# Patient Record
Sex: Female | Born: 1965 | Race: Black or African American | Hispanic: No | Marital: Married | Smoking: Never smoker
Health system: Southern US, Community
[De-identification: ages and names within clinical notes are randomized; demographics above are authoritative.]

## PROBLEM LIST (undated history)

## (undated) HISTORY — PX: ABDOMINAL HYSTERECTOMY: SHX81

## (undated) HISTORY — PX: OTHER SURGICAL HISTORY: SHX169

## (undated) HISTORY — PX: APPENDECTOMY: SHX54

---

## 1998-12-24 ENCOUNTER — Other Ambulatory Visit: Admission: RE | Admit: 1998-12-24 | Discharge: 1998-12-24 | Payer: Self-pay | Admitting: Obstetrics and Gynecology

## 2000-02-03 ENCOUNTER — Other Ambulatory Visit: Admission: RE | Admit: 2000-02-03 | Discharge: 2000-02-03 | Payer: Self-pay | Admitting: Obstetrics and Gynecology

## 2001-05-13 ENCOUNTER — Encounter: Admission: RE | Admit: 2001-05-13 | Discharge: 2001-05-13 | Payer: Self-pay | Admitting: Obstetrics

## 2003-11-14 ENCOUNTER — Other Ambulatory Visit: Admission: RE | Admit: 2003-11-14 | Discharge: 2003-11-14 | Payer: Self-pay | Admitting: Obstetrics and Gynecology

## 2004-01-01 ENCOUNTER — Encounter: Admission: RE | Admit: 2004-01-01 | Discharge: 2004-01-01 | Payer: Self-pay | Admitting: Obstetrics and Gynecology

## 2004-11-25 ENCOUNTER — Other Ambulatory Visit: Admission: RE | Admit: 2004-11-25 | Discharge: 2004-11-25 | Payer: Self-pay | Admitting: Obstetrics and Gynecology

## 2005-12-01 ENCOUNTER — Other Ambulatory Visit: Admission: RE | Admit: 2005-12-01 | Discharge: 2005-12-01 | Payer: Self-pay | Admitting: Obstetrics and Gynecology

## 2005-12-26 ENCOUNTER — Ambulatory Visit: Payer: Self-pay | Admitting: Internal Medicine

## 2007-08-10 ENCOUNTER — Encounter: Payer: Self-pay | Admitting: *Deleted

## 2007-08-10 DIAGNOSIS — N809 Endometriosis, unspecified: Secondary | ICD-10-CM | POA: Insufficient documentation

## 2007-08-10 DIAGNOSIS — Z9089 Acquired absence of other organs: Secondary | ICD-10-CM

## 2007-08-10 DIAGNOSIS — D649 Anemia, unspecified: Secondary | ICD-10-CM

## 2007-08-10 DIAGNOSIS — Z9079 Acquired absence of other genital organ(s): Secondary | ICD-10-CM | POA: Insufficient documentation

## 2007-08-10 DIAGNOSIS — N390 Urinary tract infection, site not specified: Secondary | ICD-10-CM

## 2009-04-24 ENCOUNTER — Encounter: Payer: Self-pay | Admitting: Internal Medicine

## 2010-05-20 ENCOUNTER — Encounter: Payer: Self-pay | Admitting: Internal Medicine

## 2010-05-20 LAB — CONVERTED CEMR LAB
Basophils Relative: 1 %
HDL: 42 mg/dL
Lymphocytes, automated: 54 %
Neutrophils Relative %: 38 %
Platelets: 262 10*3/uL
RBC: 4.71 M/uL
RDW: 14.2 %
Triglyceride fasting, serum: 72 mg/dL
WBC: 6.7 10*3/uL

## 2010-05-28 ENCOUNTER — Encounter: Payer: Self-pay | Admitting: Internal Medicine

## 2010-07-03 ENCOUNTER — Ambulatory Visit: Payer: Self-pay | Admitting: Internal Medicine

## 2010-07-03 DIAGNOSIS — R229 Localized swelling, mass and lump, unspecified: Secondary | ICD-10-CM | POA: Insufficient documentation

## 2010-07-08 ENCOUNTER — Encounter: Admission: RE | Admit: 2010-07-08 | Discharge: 2010-07-08 | Payer: Self-pay | Admitting: Internal Medicine

## 2010-07-11 ENCOUNTER — Encounter: Payer: Self-pay | Admitting: Internal Medicine

## 2010-10-20 ENCOUNTER — Encounter: Payer: Self-pay | Admitting: Obstetrics and Gynecology

## 2010-10-29 NOTE — Assessment & Plan Note (Signed)
Summary: NEW PT /[employer] / [employer]  #   Vital Signs:  Patient profile:   45 year old female Height:      63 inches (160.02 cm) Weight:      165.8 pounds (75.36 kg) BMI:     29.48 O2 Sat:      98 % on Room air Temp:     97.0 degrees F (36.11 degrees C) oral Pulse rate:   61 / minute BP sitting:   110 / 74  (left arm) Cuff size:   regular  Vitals Entered By: Orlan Leavens RMA (July 03, 2010 8:08 AM)  O2 Flow:  Room air CC: New patient Is Patient Diabetic? No Pain Assessment Patient in pain? no        Primary Care Mabell Esguerra:  Newt Lukes MD  CC:  New patient.  History of Present Illness: new pt to me and our practice, here to est care  here with concern for swelling "lump" located under right axilla onset <1 mo ago - not present at time of PAP/Mammo 05/2010 not a/w pain or redness - enlarging over past week - no other swelling or lumps in neck, left side or groin no fever, NS or weight changes no hx same no drainage or change in deodarant -  Preventive Screening-Counseling & Management  Alcohol-Tobacco     Alcohol drinks/day: <1     Alcohol Counseling: not indicated; use of alcohol is not excessive or problematic     Smoking Status: never     Tobacco Counseling: not indicated; no tobacco use  Caffeine-Diet-Exercise     Diet Counseling: not indicated; diet is assessed to be healthy     Does Patient Exercise: yes     Times/week: 3     Exercise Counseling: not indicated; exercise is adequate     Depression Counseling: not indicated; screening negative for depression  Safety-Violence-Falls     Seat Belt Counseling: not indicated; patient wears seat belts     Helmet Counseling: not applicable     Firearm Counseling: not applicable     Violence Counseling: not indicated; no violence risk noted     Fall Risk Counseling: not indicated; no significant falls noted  Clinical Review Panels:  Prevention   Last Pap Smear:  Interpretation Result:Negative for  intraepithelial Lesion or Malignancy.    (06/18/2010)  Immunizations   Last Tetanus Booster:  Historical (09/29/2004)   Current Medications (verified): 1)  Premarin 0.625 Mg  Tabs (Estrogens Conjugated) .... Take One Tablet Once Daily  Allergies (verified): No Known Drug Allergies  Past History:  Past Medical History: Hx of UTI'S, CHRONIC Hx of ENDOMETRIOSIS, SEVERE  ANEMIA  MD roster: gyn - tomblin  Past Surgical History: APPENDECTOMY, HX OF (ICD-V45.79) TOTAL HYSTERECTOMY AND BILATERAL SALPINGOOPHERECTOMY, HX OF     Family History: Family History of Alcoholism/Addiction (grandparent) Family History of Arthritis (parent) Family History Breast cancer 1st degree relative <50 (other relative) Family History of Colon CA 1st degree relative <60 (grandparent) Family History Diabetes 1st degree relative (grandparent, other relative) Family History High cholesterol (other relative) Family History Hypertension (parent, grandparent) Family History Ovarian cancer (other relative) Stoke & heart disease (grandparent)    Social History: Never Smoked, social alcohol married, 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 I have reviewed all other systems and they were negative.   Physical Exam  General:  alert, well-developed, well-nourished, and  cooperative to examination.    Head:  Normocephalic and atraumatic without obvious abnormalities. No apparent alopecia or balding. Eyes:  vision grossly intact; pupils equal, round and reactive to light.  conjunctiva and lids normal.    Ears:  normal pinnae bilaterally, without erythema, swelling, or tenderness to palpation. TMs clear, without effusion, or cerumen impaction. Hearing grossly normal bilaterally  Mouth:  teeth and gums in good repair; mucous membranes moist, without lesions or ulcers. oropharynx clear without exudate, no erythema.  Neck:  supple, full ROM,  no masses, no thyromegaly; no thyroid nodules or tenderness. no JVD or carotid bruits.   Lungs:  normal respiratory effort, no intercostal retractions or use of accessory muscles; normal breath sounds bilaterally - no crackles and no wheezes.    Heart:  normal rate, regular rhythm, no murmur, and no rub. BLE without edema.  Genitalia:  defer gyn Msk:  No deformity or scoliosis noted of thoracic or lumbar spine.   Neurologic:  alert & oriented X3 and cranial nerves II-XII symetrically intact.  strength normal in all extremities, sensation intact to light touch, and gait normal. speech fluent without dysarthria or aphasia; follows commands with good comprehension.  Skin:  no rashes, vesicles, ulcers, or erythema. No nodules or irregularity to palpation.  Cervical Nodes:  No lymphadenopathy noted Axillary Nodes:  slight prominence R>L axillary LNs - nontendewr, no fluctence - no HS Inguinal Nodes:  No significant adenopathy Psych:  Oriented X3, memory intact for recent and remote, normally interactive, good eye contact, not anxious appearing, not depressed appearing, and not agitated.      Impression & Recommendations:  Problem # 1:  LOCALIZED SUPERFICIAL SWELLING MASS OR LUMP (ICD-782.2)  Orders: Radiology Referral (Radiology)  check [place] for >LN enlargement - no sx or symptoms infx - no HS on exam  Complete Medication List: 1)  Premarin 0.625 Mg Tabs (Estrogens conjugated) .... Take one tablet once daily  Patient Instructions: 1)  it was good to see you today. 2)  will send for records from dr. Henderson Cloud re: your recent physical and labs 3)  we'll make referral for ultrasound of your right underarm as discussed. Our office will contact you regarding this appointment once made. We will then contact you after results recieved and reviewed 4)  Please schedule a follow-up appointment as needed.   Pap Smear  Procedure date:  06/18/2010  Findings:      Interpretation Result:Negative for  intraepithelial Lesion or Malignancy.       Immunization History:  Tetanus/Td Immunization History:    Tetanus/Td:  historical (09/29/2004)

## 2010-10-29 NOTE — Letter (Signed)
Summary: [facility]  [facility]   Imported By: Sherian Rein 07/16/2010 14:45:36  _____________________________________________________________________  External Attachment:    Type:   Image     Comment:   External Document

## 2012-03-18 IMAGING — US US MISC SOFT TISSUE
1 series · 14 of 16 positions shown · non-contrast
Comparison: None.

CLINICAL DATA: Palpable right axillary lesion.

ULTRASOUND RIGHT UPPER EXTREMITY LIMITED
TECHNIQUE: Ultrasound examination of the region of interest in the
right axilla was performed.

[Series 1: us misc soft tissue · 0.07mm/px · 14 of 33 slices shown]
[im 1/33]
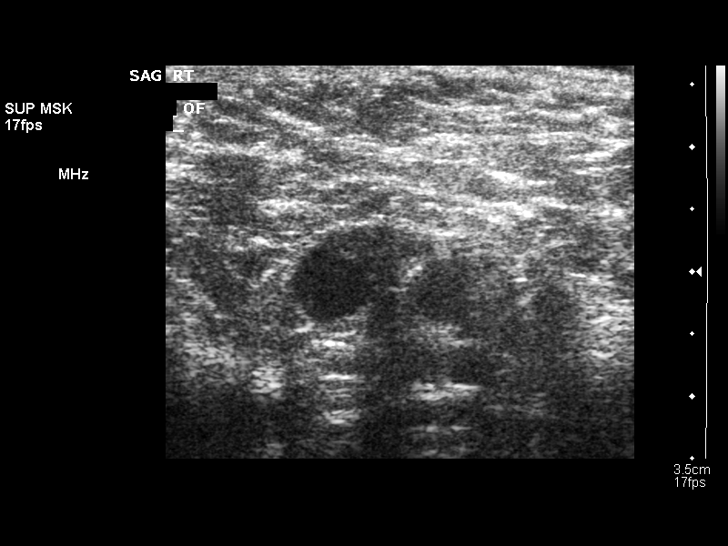
[im 3/33]
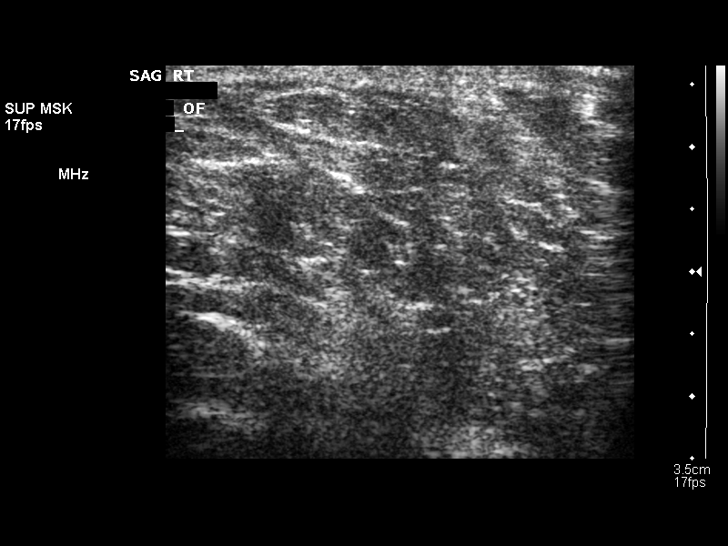
[im 5/33]
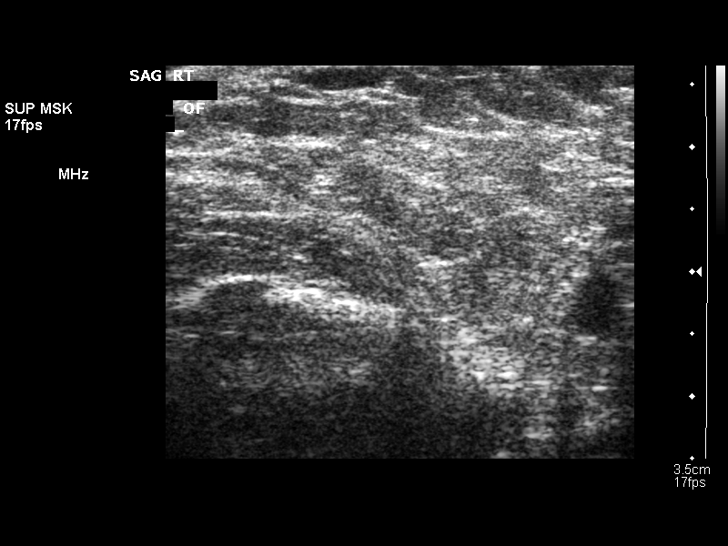
[im 9/33]
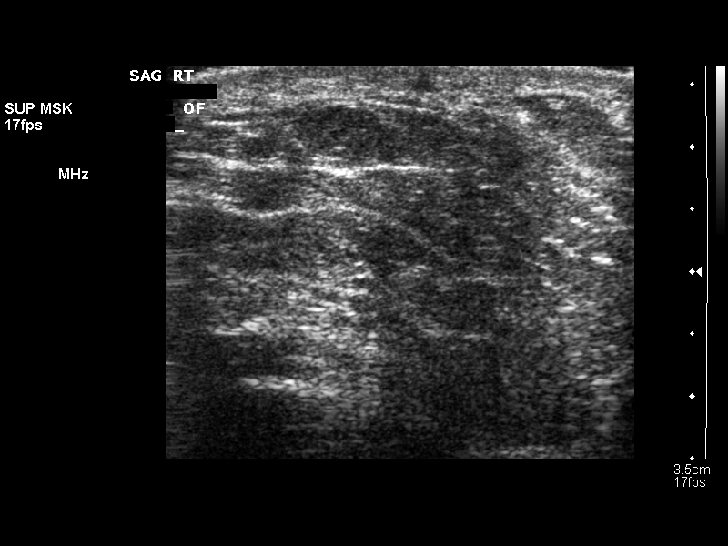
[im 11/33]
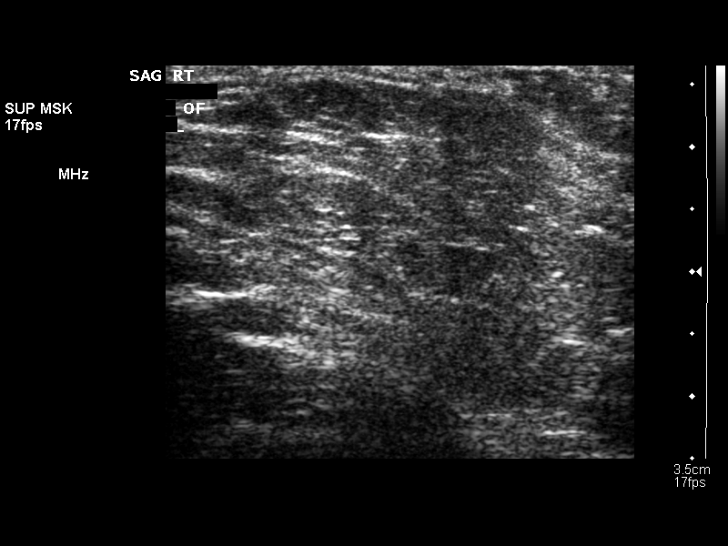
[im 13/33]
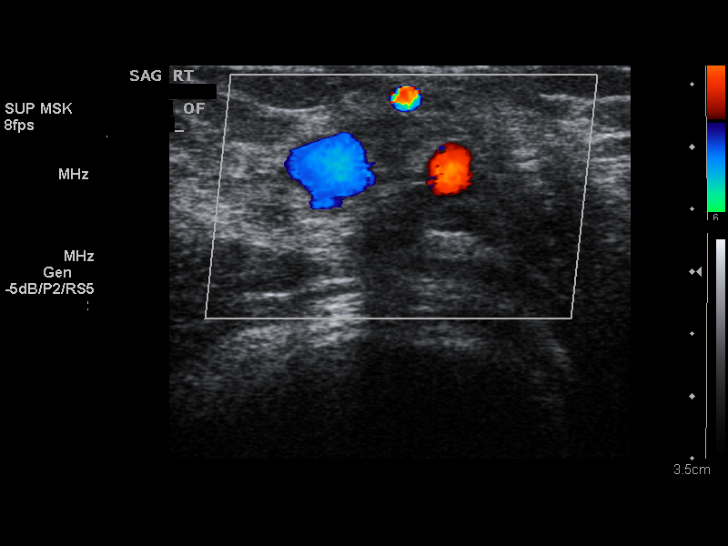
[im 15/33]
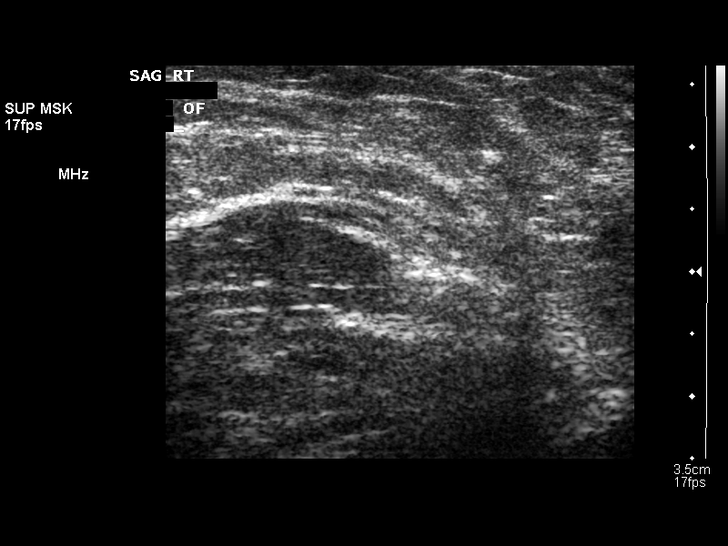
[im 18/33]
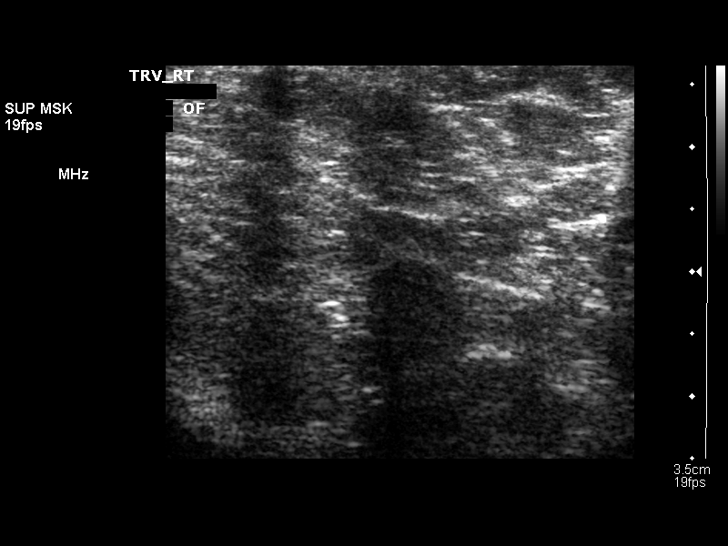
[im 20/33]
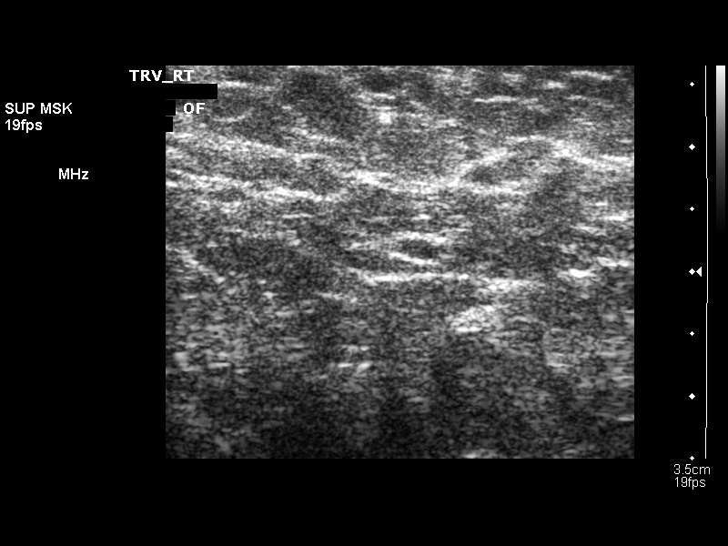
[im 22/33]
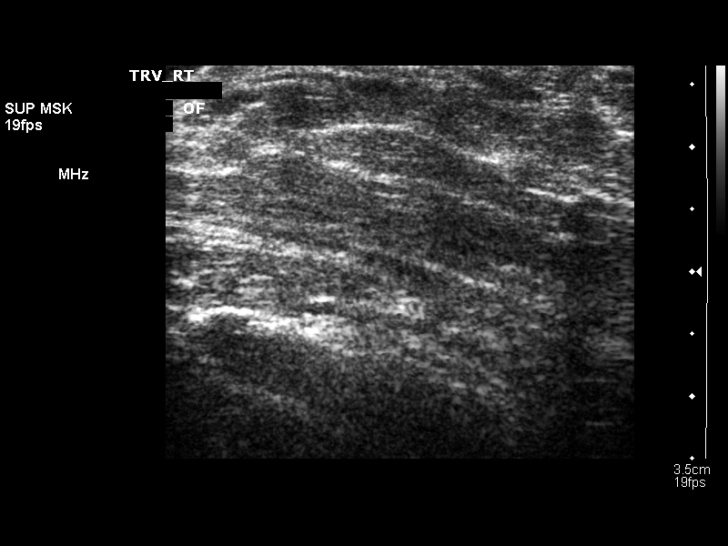
[im 26/33]
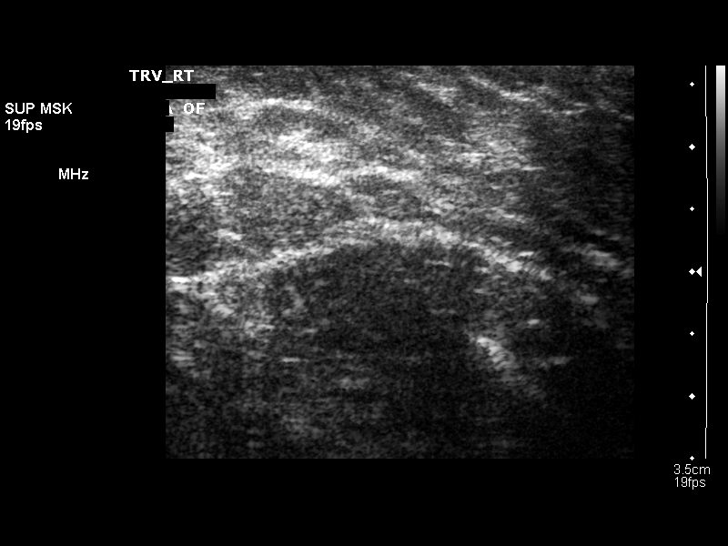
[im 28/33]
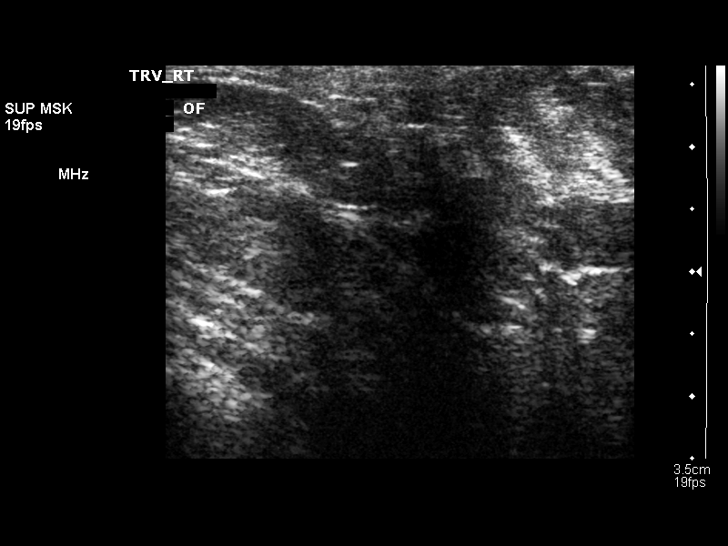
[im 30/33]
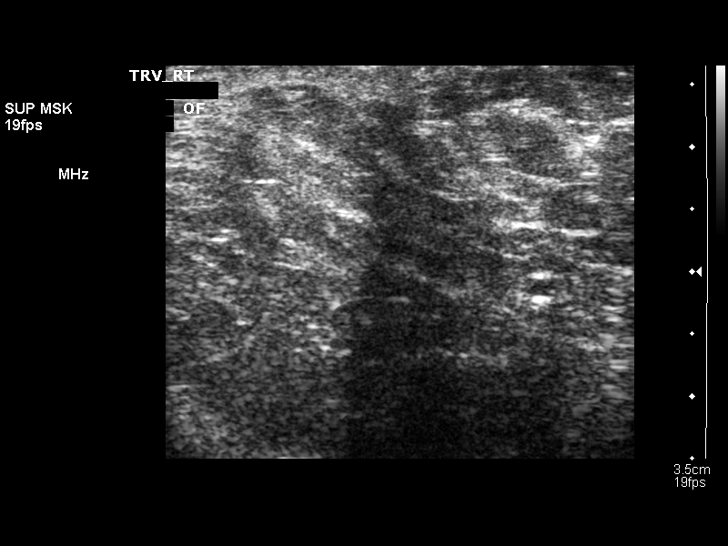
[im 33/33]
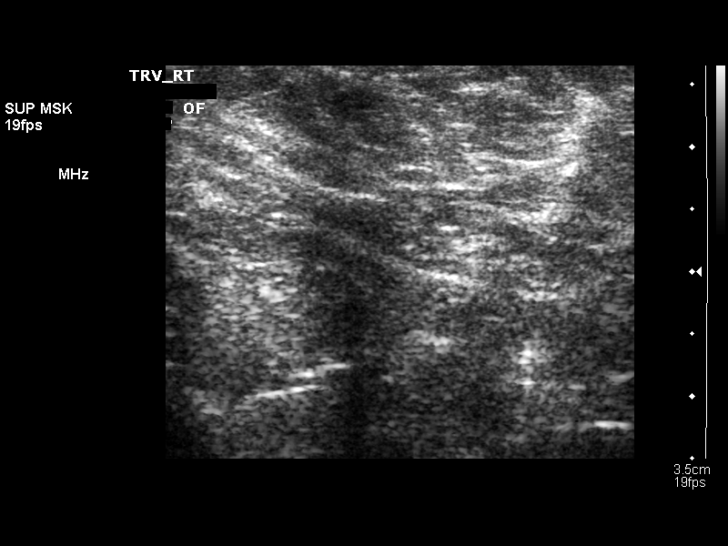

[14 of 16 positions shown; findings below may reference images not displayed]

FINDINGS: No sonographic solid or cystic mass or adenopathy seen.
IMPRESSION: Negative.

## 2013-03-16 ENCOUNTER — Emergency Department (HOSPITAL_COMMUNITY)
Admission: EM | Admit: 2013-03-16 | Discharge: 2013-03-17 | Disposition: A | Discharge status: Home / Self Care | Payer: BC Managed Care – PPO | Attending: Emergency Medicine | Admitting: Emergency Medicine

## 2013-03-16 ENCOUNTER — Encounter (HOSPITAL_COMMUNITY): Payer: Self-pay | Admitting: *Deleted

## 2013-03-16 DIAGNOSIS — Z79899 Other long term (current) drug therapy: Secondary | ICD-10-CM | POA: Insufficient documentation

## 2013-03-16 DIAGNOSIS — S81009A Unspecified open wound, unspecified knee, initial encounter: Secondary | ICD-10-CM | POA: Insufficient documentation

## 2013-03-16 DIAGNOSIS — W268XXA Contact with other sharp object(s), not elsewhere classified, initial encounter: Secondary | ICD-10-CM | POA: Insufficient documentation

## 2013-03-16 DIAGNOSIS — S91009A Unspecified open wound, unspecified ankle, initial encounter: Secondary | ICD-10-CM | POA: Insufficient documentation

## 2013-03-16 DIAGNOSIS — Y9389 Activity, other specified: Secondary | ICD-10-CM | POA: Insufficient documentation

## 2013-03-16 DIAGNOSIS — Y929 Unspecified place or not applicable: Secondary | ICD-10-CM | POA: Insufficient documentation

## 2013-03-16 DIAGNOSIS — IMO0002 Reserved for concepts with insufficient information to code with codable children: Secondary | ICD-10-CM

## 2013-03-16 NOTE — ED Provider Notes (Addendum)
History     CSN: [id number]  Arrival date & time 03/16/13  2200   First MD Initiated Contact with Patient 03/16/13 2324      Chief Complaint  Patient presents with  . Extremity Laceration     Patient is a 47 y.o. female presenting with skin laceration.  Laceration Location:  Leg Leg laceration location:  R lower leg Depth:  Cutaneous Quality: straight   Bleeding: controlled   Time since incident: just prior to arrival. Laceration mechanism:  Broken glass Pain details:    Quality:  Aching   Severity:  Moderate   Timing:  Constant   Progression:  Improving Foreign body present:  No foreign bodies Relieved by:  Certain positions Worsened by:  Movement and pressure Tetanus status:  Up to date  HPI Comments: Lisa Spears is a 47 y.o. female who presents to the Emergency Department complaining of an extremity laceration. Patient dropped a corning wear plate that shattered and cut her on the posterior R Calf. UTD on her tetanus and bleeding controlled. Dish was clean. Patient denies any hx of uncontrolled bruising or bleeding. Lisa Spears is not takeing blood thinners. Patient denies inablity to move  Her ankle, numbness, tingling, weakness. Lisa Spears is able to ambulate with some pain.  No paresthesias or weakness.  History reviewed. No pertinent past medical history.  Past Surgical History  Procedure Laterality Date  . Appendectomy    . Other surgical history      "multiple surgeries for endometreosis"  . Abdominal hysterectomy      No family history on file.  History  Substance Use Topics  . Smoking status: Never Smoker   . Smokeless tobacco: Not on file  . Alcohol Use: No    OB History   Grav Para Term Preterm Abortions TAB SAB Ect Mult Living                  Review of Systems  Gastrointestinal: Negative for nausea.  Musculoskeletal: Positive for gait problem. Negative for joint swelling and arthralgias.  Skin: Positive for wound.  Neurological: Negative for  weakness and numbness.  Hematological: Does not bruise/bleed easily.     Allergies  Review of patient's allergies indicates no known allergies.  Home Medications   Current Outpatient Rx  Name  Route  Sig  Dispense  Refill  . estrogens, conjugated, (PREMARIN) 0.3 MG tablet   Oral   Take 0.3 mg by mouth daily. Take daily for 21 days then do not take for 7 days.           BP 150/80  Pulse 60  Temp(Src) 98.6 F (37 C) (Oral)  Resp 20  Ht 5\' 3"  (1.6 m)  Wt 157 lb 8 oz (71.442 kg)  BMI 27.91 kg/m2  SpO2 100%  Physical Exam  Constitutional: Lisa Spears is oriented to person, place, and time. Lisa Spears appears well-developed and well-nourished. No distress.  HENT:  Head: Normocephalic and atraumatic.  Eyes: Conjunctivae are normal. No scleral icterus.  Neck: Normal range of motion.  Cardiovascular: Normal rate, regular rhythm and normal heart sounds.  Exam reveals no gallop and no friction rub.   No murmur heard. Pulmonary/Chest: Effort normal and breath sounds normal. No respiratory distress.  Abdominal: Soft. Bowel sounds are normal. Lisa Spears exhibits no distension and no mass. There is no tenderness. There is no guarding.  Neurological: Lisa Spears is alert and oriented to person, place, and time.  Skin: Skin is warm and dry. Lisa Spears is not diaphoretic.  4 cm superficial laceration of the Rt calf.     ED Course  Procedures (including critical care time) DIAGNOSTIC STUDIES: Oxygen Saturation is 100% on RA, normal by my interpretation.    COORDINATION OF CARE: 11:25 PM Discussed treatment plan which includes laceration repair with pt at bedside and pt agreed to plan.   LACERATION REPAIR Performed by: Arthor Captain, PA-C Consent: Verbal consent obtained. Risks and benefits: risks, benefits and alternatives were discussed Patient identity confirmed: provided demographic data Time out performed prior to procedure Prepped and Draped in normal sterile fashion Wound explored Laceration Location: R  post calf Laceration Length: 4 cm No Foreign Bodies seen or palpated Anesthesia: local infiltration Local anesthetic: lidocaine 2% w epinephrine Anesthetic total: 3 ml Irrigation method: syringe Amount of cleaning: standard Skin closure: 4.0 prolene Number of sutures or staples: 5 Technique: si Patient tolerance: Patient tolerated the procedure well with no immediate complications.   Labs Reviewed - No data to display No results found.   1. Laceration       MDM  Tdap current.Pressure irrigation performed. Laceration occurred < 8 hours prior to repair which was well tolerated. Pt has no co morbidities to effect normal wound healing. Discussed suture home care w pt and answered questions. Pt to f-u for wound check and suture removal in 7 days. Pt is hemodynamically stable w no complaints prior to dc.     I personally performed the services described in this documentation, which was scribed in my presence. The recorded information has been reviewed and is accurate.         Arthor Captain, PA-C 03/17/13 1610  Arthor Captain, PA-C 03/17/13 1637  Arthor Captain, PA-C 03/24/13 [phone]

## 2013-03-16 NOTE — ED Notes (Signed)
Pt presents w/ laceration to rt posterior calf, bleeding controlled PTA. Pt states she dropped some cookware on it cut the back of her leg as it fell. Laceration is approx 4cm in length.

## 2013-03-17 NOTE — ED Notes (Signed)
[id number]<YQ> Expected date:<BR> Expected time:<BR> Means of arrival:<BR> Comments:<BR> Zendejas; laceration

## 2013-03-17 NOTE — ED Notes (Signed)
[id number]<AV> Expected date:<BR> Expected time:<BR> Means of arrival:<BR> Comments:<BR> Garner/dental pain

## 2013-03-18 NOTE — ED Provider Notes (Signed)
Medical screening examination/treatment/procedure(s) were performed by non-physician practitioner and as supervising physician I was immediately available for consultation/collaboration.    Vida Roller, MD 03/18/13 [phone]

## 2013-03-24 NOTE — ED Provider Notes (Signed)
Medical screening examination/treatment/procedure(s) were performed by non-physician practitioner and as supervising physician I was immediately available for consultation/collaboration.    Vida Roller, MD 03/24/13 [phone]

## 2013-03-25 ENCOUNTER — Emergency Department (HOSPITAL_COMMUNITY)
Admission: EM | Admit: 2013-03-25 | Discharge: 2013-03-25 | Disposition: A | Discharge status: Home / Self Care | Payer: BC Managed Care – PPO | Attending: Emergency Medicine | Admitting: Emergency Medicine

## 2013-03-25 ENCOUNTER — Encounter (HOSPITAL_COMMUNITY): Payer: Self-pay | Admitting: Family Medicine

## 2013-03-25 DIAGNOSIS — Z7982 Long term (current) use of aspirin: Secondary | ICD-10-CM | POA: Insufficient documentation

## 2013-03-25 DIAGNOSIS — Z4802 Encounter for removal of sutures: Secondary | ICD-10-CM | POA: Insufficient documentation

## 2013-03-25 NOTE — ED Notes (Signed)
Patient here to have sutures removed

## 2013-03-25 NOTE — ED Provider Notes (Signed)
   History    CSN: [id number] Arrival date & time 03/25/13  2139  First MD Initiated Contact with Patient 03/25/13 2301      Chief complaint: Possible suture removal   HPI Patient presents with request for suture removal.  Sutures were placed 9 days ago in her right posterior distal lower extremity just above the lower Achilles tendon.  She reports no fever chills.  No redness.  She thinks maybe it may be too early for the sutures to be removed as it doesn't look as though the wound is completely healed at this time.  She has no other complaints.  No past medical history on file. Past Surgical History  Procedure Laterality Date  . Appendectomy    . Other surgical history      "multiple surgeries for endometreosis"  . Abdominal hysterectomy     No family history on file. History  Substance Use Topics  . Smoking status: Never Smoker   . Smokeless tobacco: Not on file  . Alcohol Use: No   OB History   Grav Para Term Preterm Abortions TAB SAB Ect Mult Living                 Review of Systems  All other systems reviewed and are negative.    Allergies  Review of patient's allergies indicates no known allergies.  Home Medications   Current Outpatient Rx  Name  Route  Sig  Dispense  Refill  . aspirin 325 MG tablet   Oral   Take 325 mg by mouth daily.         Marland Kitchen estrogens, conjugated, (PREMARIN) 0.3 MG tablet   Oral   Take 0.3 mg by mouth.           There were no vitals taken for this visit. Physical Exam  Nursing note and vitals reviewed. Constitutional: She is oriented to person, place, and time. She appears well-developed and well-nourished.  HENT:  Head: Normocephalic.  Eyes: EOM are normal.  Neck: Normal range of motion.  Pulmonary/Chest: Effort normal.  Abdominal: She exhibits no distension.  Musculoskeletal: Normal range of motion.  Well healing laceration with approximately 5-6 Sutures in Pl. in the right posterior distal calf.  The secondary signs of  infection.  The wound has not completely healed at this time.  It is too early for suture removal  Neurological: She is alert and oriented to person, place, and time.  Psychiatric: She has a normal mood and affect.    ED Course  Procedures (including critical care time) Labs Reviewed - No data to display No results found. 1. Visit for suture removal     MDM  Too early for suture removal.  She will try an urgent care Monday or Tuesday for suture removal.  Infection warnings given.  No signs of infection at this time  Lyanne Co, MD 03/25/13 2309

## 2018-04-18 ENCOUNTER — Encounter: Payer: Self-pay | Admitting: Nurse Practitioner

## 2018-04-18 ENCOUNTER — Ambulatory Visit: Payer: Self-pay | Admitting: Nurse Practitioner

## 2018-04-18 VITALS — BP 110/80 | HR 71 | Temp 98.6°F | Wt 175.8 lb

## 2018-04-18 DIAGNOSIS — R300 Dysuria: Secondary | ICD-10-CM

## 2018-04-18 DIAGNOSIS — R319 Hematuria, unspecified: Secondary | ICD-10-CM

## 2018-04-18 DIAGNOSIS — N390 Urinary tract infection, site not specified: Secondary | ICD-10-CM

## 2018-04-18 LAB — POC URINALSYSI DIPSTICK (AUTOMATED)
BILIRUBIN UA: NEGATIVE
Glucose, UA: NEGATIVE
KETONES UA: NEGATIVE
Nitrite, UA: POSITIVE
PH UA: 7 (ref 5.0–8.0)
Protein, UA: POSITIVE — AB
SPEC GRAV UA: 1.025 (ref 1.010–1.025)
Urobilinogen, UA: 0.2 E.U./dL

## 2018-04-18 MED ORDER — NITROFURANTOIN MONOHYD MACRO 100 MG PO CAPS: 100.0000 mg | ORAL_CAPSULE | Freq: Two times a day (BID) | ORAL | 0 refills | Status: AC

## 2018-04-18 NOTE — Progress Notes (Signed)
Subjective:    Lisa Spears is a 52 y.o. female who complains of burning with urination, right flank pain, foul smelling urine, frequency, hematuria, suprapubic pressure and urgency for 5 days.  Patient also complains of back pain. Patient denies fever, headache, rhinitis, sorethroat, stomach ache and vaginal discharge.  Patient states her symptoms started after she had sexual intercourse and forgot to void afterwards. Patient does have a history of recurrent UTI.  Patient does not have a history of pyelonephritis.  Patient states her last UTI was approximately 1 year ago.  The patient states in the past they were pretty frequent and she did follow-up with urologist.  The patient states after seeing the urologist her urinary tract infection seemed to dissipate.  The patient states since symptom onset of this UTI, she has been taking several medications to include Azo, and some old antibiotics that she had.  The following portions of the patient's history were reviewed and updated as appropriate: allergies, current medications and past medical history.  Review of Systems Constitutional: positive for chills and fevers, negative for anorexia, fatigue, malaise and sweats Eyes: negative Ears, nose, mouth, throat, and face: negative Respiratory: negative Cardiovascular: negative Gastrointestinal: negative Genitourinary:positive for dysuria, frequency, hematuria and See HPI, negative for vaginal discharge and See HPI, nocturia and urinary incontinence    Objective:    BP 110/80   Pulse 71   Temp 98.6 F (37 C)   Wt 175 lb 12.8 oz (79.7 kg)   SpO2 98%   BMI 31.14 kg/m  General: alert, cooperative and no distress  Abdomen: soft, non-tender, without masses or organomegaly in the entire abdomen  Back: back muscles are full ROM, right CVA tenderness  GU: defer exam   Laboratory:  Urine dipstick shows sp gravity 1.025, negative for glucose, large for hemoglobin, negative for ketones, large  for leukocyte esterase, 2+ for nitrites, trace for protein and 0.2 for urobilinogen.   Micro exam: not done.    Assessment:    Acute Cystitis with Hematuria    Plan:   Exam findings, diagnosis etiology and medication use and indications reviewed with patient. Follow- Up and discharge instructions provided. No emergent/urgent issues found on exam.  Patient verbalized understanding of information provided and agrees with plan of care (POC), all questions answered.  1. Burning with urination  - POCT Urinalysis Dipstick (Automated)  2. Urinary tract infection with hematuria, site unspecified  - nitrofurantoin, macrocrystal-monohydrate, (MACROBID) 100 MG capsule; Take 1 capsule (100 mg total) by mouth 2 (two) times daily for 5 days.  Dispense: 10 capsule; Refill: 0 -Instructed to increase fluids.  Patient instructed to avoid caffeine such as teas sodas and coffees. -Patient instructed to develop a toileting schedule where she is toileting at least every 2 hours. -Patient instructed to void immediately after sexual intercourse since she has a significant history of UTIs. -Patient instructed to follow-up in the ER if she has fevers, chills, worsening back or flank pain, or symptoms do not improve.

## 2018-04-18 NOTE — Patient Instructions (Signed)

## 2018-04-20 ENCOUNTER — Telehealth: Payer: Self-pay

## 2018-04-20 NOTE — Telephone Encounter (Signed)
I left a message asking the patient to call us back. 

## 2019-08-22 ENCOUNTER — Ambulatory Visit
Admission: EM | Admit: 2019-08-22 | Discharge: 2019-08-22 | Disposition: A | Discharge status: Home / Self Care | Payer: BC Managed Care – PPO | Attending: Physician Assistant | Admitting: Physician Assistant

## 2019-08-22 DIAGNOSIS — M545 Low back pain, unspecified: Secondary | ICD-10-CM

## 2019-08-22 MED ORDER — TIZANIDINE HCL 4 MG PO TABS: 4.0000 mg | ORAL_TABLET | Freq: Three times a day (TID) | ORAL | 0 refills | Status: DC | PRN

## 2019-08-22 MED ORDER — MELOXICAM 7.5 MG PO TABS: 7.5000 mg | ORAL_TABLET | Freq: Every day | ORAL | 0 refills | Status: DC

## 2019-08-22 NOTE — ED Triage Notes (Signed)
Pt states went to grab something and developed a sharp pain to rt lower back radiating to anterior rt leg.

## 2019-08-22 NOTE — Discharge Instructions (Addendum)
Start Mobic. Do not take ibuprofen (motrin/advil)/ naproxen (aleve) while on mobic.Robaxin as needed, this can make you drowsy, so do not take if you are going to drive, operate heavy machinery, or make important decisions. Ice/heat compresses as needed. This can take up to 3-4 weeks to completely resolve, but you should be feeling better each week. Follow up with PCP if symptoms worsen, changes for reevaluation. If experience numbness/tingling of the inner thighs, loss of bladder or bowel control, go to the emergency department for evaluation.  ° °

## 2019-08-22 NOTE — ED Provider Notes (Signed)
[facility]    CSN: [id number] Arrival date & time: 08/22/19  1048      History   Chief Complaint Chief Complaint  Patient presents with  . Back Pain    HPI Lisa Spears is a 53 y.o. female.   53 year old female comes in for 2-day history of right lower back pain.  She was bending over to grab something when she felt a sharp pain to the right lower back radiating to anterior right leg.  States due to pain, she stood for about 20 minutes before she could walk without significant pain.  Since then, sharp pain has resolved, but has a dull aching pain that is constant, worse with movement.  Radiation of pain has resolved, and denies any hip/leg pain.  Denies saddle anesthesia, loss of bladder or bowel control.  Denies leg weakness, falling.  Has been taking ibuprofen/Aleve with temporary relief.      History reviewed. No pertinent past medical history.  Patient Active Problem List   Diagnosis Date Noted  . LOCALIZED SUPERFICIAL SWELLING MASS OR LUMP 07/03/2010  . ANEMIA 08/10/2007  . UTI'S, CHRONIC 08/10/2007  . ENDOMETRIOSIS, SEVERE 08/10/2007  . TOTAL HYSTERECTOMY AND BILATERAL SALPINGOOPHERECTOMY, HX OF 08/10/2007  . APPENDECTOMY, HX OF 08/10/2007    Past Surgical History:  Procedure Laterality Date  . ABDOMINAL HYSTERECTOMY    . APPENDECTOMY    . OTHER SURGICAL HISTORY     "multiple surgeries for endometreosis"    OB History   No obstetric history on file.      Home Medications    Prior to Admission medications   Medication Sig Start Date End Date Taking? Authorizing Provider  aspirin 325 MG tablet Take 325 mg by mouth daily.    [provider]  estrogens, conjugated, (PREMARIN) 0.3 MG tablet Take 0.3 mg by mouth.     [provider]  meloxicam (MOBIC) 7.5 MG tablet Take 1 tablet (7.5 mg total) by mouth daily. 08/22/19   Tasia Catchings, Jaidynn Balster V, PA-C  tiZANidine (ZANAFLEX) 4 MG tablet Take 1 tablet (4 mg total) by mouth every 8  (eight) hours as needed for muscle spasms. 08/22/19   Ok Edwards, PA-C    Family History No family history on file.  Social History Social History   Tobacco Use  . Smoking status: Never Smoker  . Smokeless tobacco: Never Used  Substance Use Topics  . Alcohol use: No  . Drug use: No     Allergies   Patient has no known allergies.   Review of Systems Review of Systems  Reason unable to perform ROS: See HPI as above.     Physical Exam Triage Vital Signs ED Triage Vitals  Enc Vitals Group     BP 08/22/19 1100 (!) 144/88     Pulse Rate 08/22/19 1100 68     Resp 08/22/19 1100 16     Temp 08/22/19 1100 98.2 F (36.8 C)     Temp Source 08/22/19 1100 Oral     SpO2 08/22/19 1100 98 %     Weight --      Height --      Head Circumference --      Peak Flow --      Pain Score 08/22/19 1111 5     Pain Loc --      Pain Edu? --      Excl. in [place]? --    No data found.  Updated Vital Signs BP (!) 144/88 (BP  Location: Left Arm)   Pulse 68   Temp 98.2 F (36.8 C) (Oral)   Resp 16   SpO2 98%   Physical Exam Constitutional:      General: She is not in acute distress.    Appearance: She is well-developed. She is not diaphoretic.  HENT:     Head: Normocephalic and atraumatic.  Eyes:     Conjunctiva/sclera: Conjunctivae normal.     Pupils: Pupils are equal, round, and reactive to light.  Cardiovascular:     Rate and Rhythm: Normal rate and regular rhythm.     Heart sounds: Normal heart sounds. No murmur. No friction rub. No gallop.   Pulmonary:     Effort: Pulmonary effort is normal. No accessory muscle usage or respiratory distress.     Breath sounds: Normal breath sounds. No stridor. No decreased breath sounds, wheezing, rhonchi or rales.  Musculoskeletal:     Comments: No tenderness on palpation of the spinous processes. Tenderness to palpation diffusely of right lumbar region. No tenderness to palpation of the hips. Full range of motion of hip and back. Strength  deferred as pain with hip ROM. Sensation intact and equal bilaterally. Negative straight leg raise.   Able to ambulate on own without difficulty. No changes in gait/coordination.   Skin:    General: Skin is warm and dry.  Neurological:     Mental Status: She is alert and oriented to person, place, and time.    UC Treatments / Results  Labs (all labs ordered are listed, but only abnormal results are displayed) Labs Reviewed - No data to display  EKG   Radiology No results found.  Procedures Procedures (including critical care time)  Medications Ordered in UC Medications - No data to display  Initial Impression / Assessment and Plan / UC Course  I have reviewed the triage vital signs and the nursing notes.  Pertinent labs & imaging results that were available during my care of the patient were reviewed by me and considered in my medical decision making (see chart for details).    Start NSAID as directed for pain and inflammation. Muscle relaxant as needed. Ice/heat compresses. Discussed with patient this can take up to 3-4 weeks to resolve, but should be getting better each week. Return precautions given.   Final Clinical Impressions(s) / UC Diagnoses   Final diagnoses:  Acute right-sided low back pain without sciatica   ED Prescriptions    Medication Sig Dispense Auth. Provider   meloxicam (MOBIC) 7.5 MG tablet Take 1 tablet (7.5 mg total) by mouth daily. 10 tablet Ronell Duffus V, PA-C   tiZANidine (ZANAFLEX) 4 MG tablet Take 1 tablet (4 mg total) by mouth every 8 (eight) hours as needed for muscle spasms. 15 tablet Belinda Fisher, PA-C     PDMP not reviewed this encounter.   Belinda Fisher, PA-C 08/22/19 1156

## 2019-09-29 ENCOUNTER — Ambulatory Visit
Admission: EM | Admit: 2019-09-29 | Discharge: 2019-09-29 | Disposition: A | Discharge status: Home / Self Care | Payer: BC Managed Care – PPO | Attending: Physician Assistant | Admitting: Physician Assistant

## 2019-09-29 ENCOUNTER — Other Ambulatory Visit: Payer: Self-pay

## 2019-09-29 DIAGNOSIS — Z20828 Contact with and (suspected) exposure to other viral communicable diseases: Secondary | ICD-10-CM | POA: Diagnosis not present

## 2019-09-29 DIAGNOSIS — Z20822 Contact with and (suspected) exposure to covid-19: Secondary | ICD-10-CM

## 2019-09-29 NOTE — ED Triage Notes (Signed)
Pt c/o positive exposure to COVID from her husband in the home 12/25, pt denies sx's

## 2019-09-29 NOTE — ED Provider Notes (Signed)
[facility]    CSN: [id number] Arrival date & time: 09/29/19  0946      History   Chief Complaint Chief Complaint  Patient presents with  . covid exposure    HPI Lisa Spears is a 53 y.o. female.   53 year old female comes in for COVID testing after positive exposure. Lives with husband, who tested positive for COVID 09/26/2019. Patient remains asymptomatic. Denies URI symptoms such as cough, congestion, sore throat. Denies fever, chills, body aches. Denies abdominal pain, nausea, vomiting, diarrhea. Denies shortness of breath, loss of taste/smell. No antipyretics in the last 8 hours.      History reviewed. No pertinent past medical history.  Patient Active Problem List   Diagnosis Date Noted  . LOCALIZED SUPERFICIAL SWELLING MASS OR LUMP 07/03/2010  . ANEMIA 08/10/2007  . UTI'S, CHRONIC 08/10/2007  . ENDOMETRIOSIS, SEVERE 08/10/2007  . TOTAL HYSTERECTOMY AND BILATERAL SALPINGOOPHERECTOMY, HX OF 08/10/2007  . APPENDECTOMY, HX OF 08/10/2007    Past Surgical History:  Procedure Laterality Date  . ABDOMINAL HYSTERECTOMY    . APPENDECTOMY    . OTHER SURGICAL HISTORY     "multiple surgeries for endometreosis"    OB History   No obstetric history on file.      Home Medications    Prior to Admission medications   Medication Sig Start Date End Date Taking? Authorizing Provider  aspirin 325 MG tablet Take 325 mg by mouth daily.    [provider]  estrogens, conjugated, (PREMARIN) 0.3 MG tablet Take 0.3 mg by mouth.     [provider]    Family History History reviewed. No pertinent family history.  Social History Social History   Tobacco Use  . Smoking status: Never Smoker  . Smokeless tobacco: Never Used  Substance Use Topics  . Alcohol use: No  . Drug use: No     Allergies   Patient has no known allergies.   Review of Systems Review of Systems  Reason unable to perform ROS: See HPI as above.      Physical Exam Triage Vital Signs ED Triage Vitals  Enc Vitals Group     BP 09/29/19 1018 (!) 137/92     Pulse --      Resp 09/29/19 1018 18     Temp 09/29/19 1018 98.5 F (36.9 C)     Temp Source 09/29/19 1018 Oral     SpO2 09/29/19 1018 97 %     Weight --      Height --      Head Circumference --      Peak Flow --      Pain Score 09/29/19 1019 0     Pain Loc --      Pain Edu? --      Excl. in [place]? --    No data found.  Updated Vital Signs BP (!) 137/92 (BP Location: Left Arm)   Temp 98.5 F (36.9 C) (Oral)   Resp 18   SpO2 97%   Physical Exam Constitutional:      General: She is not in acute distress.    Appearance: Normal appearance. She is not ill-appearing, toxic-appearing or diaphoretic.  HENT:     Head: Normocephalic and atraumatic.     Mouth/Throat:     Mouth: Mucous membranes are moist.     Pharynx: Oropharynx is clear. Uvula midline.  Cardiovascular:     Rate and Rhythm: Normal rate and regular rhythm.     Heart  sounds: Normal heart sounds. No murmur. No friction rub. No gallop.   Pulmonary:     Effort: Pulmonary effort is normal. No accessory muscle usage, prolonged expiration, respiratory distress or retractions.     Comments: Lungs clear to auscultation without adventitious lung sounds. Musculoskeletal:     Cervical back: Normal range of motion and neck supple.  Neurological:     General: No focal deficit present.     Mental Status: She is alert and oriented to person, place, and time.      UC Treatments / Results  Labs (all labs ordered are listed, but only abnormal results are displayed) Labs Reviewed  NOVEL CORONAVIRUS, NAA    EKG   Radiology No results found.  Procedures Procedures (including critical care time)  Medications Ordered in UC Medications - No data to display  Initial Impression / Assessment and Plan / UC Course  I have reviewed the triage vital signs and the nursing notes.  Pertinent labs & imaging results that  were available during my care of the patient were reviewed by me and considered in my medical decision making (see chart for details).    Discussed with patient, given positive exposure without symptoms, could still be within incubation period. Given patient living with positive COVID family member, she will need to quarantine for 14 days regardless of testing results. Patient expresses understanding and would like to proceed with testing.  COVID testing ordered.  Patient will continue to monitor symptoms, may need to extend quarantine if develop any symptoms.  Return precautions given.  Final Clinical Impressions(s) / UC Diagnoses   Final diagnoses:  Exposure to COVID-19 virus   ED Prescriptions    None     PDMP not reviewed this encounter.   Ok Edwards, PA-C 09/29/19 1106

## 2019-09-29 NOTE — Discharge Instructions (Signed)
COVID testing ordered. As discussed, given your exposure, I would like you to quarantine for 14 days regardless of results. Can leave quarantine on 10/09/2018. If testing positive today, will update quarantine instructions. Monitor for any symptoms such as cough, congestion, shortness of breath, loss of taste/smell, fever, may need to extend quarantine time. Go to the emergency department for further evaluation if you develop significant shortness of breath, cannot speak in full sentences.

## 2019-10-01 LAB — NOVEL CORONAVIRUS, NAA: SARS-CoV-2, NAA: NOT DETECTED
